# Patient Record
Sex: Male | Born: 1980 | Race: White | Hispanic: No | Marital: Married | Smoking: Former smoker
Health system: Southern US, Community
[De-identification: ages and names within clinical notes are randomized; demographics above are authoritative.]

## PROBLEM LIST (undated history)

## (undated) DIAGNOSIS — S72001A Fracture of unspecified part of neck of right femur, initial encounter for closed fracture: Secondary | ICD-10-CM

## (undated) HISTORY — PX: NO PAST SURGERIES: SHX2092

---

## 2016-06-15 ENCOUNTER — Ambulatory Visit: Admit: 2016-06-15 | Payer: Self-pay | Admitting: Orthopedic Surgery

## 2016-06-15 SURGERY — FIXATION, FEMUR, NECK, PERCUTANEOUS, USING SCREW
Anesthesia: General | Site: Hip | Laterality: Right

## 2016-06-22 ENCOUNTER — Encounter (HOSPITAL_COMMUNITY): Payer: Self-pay | Admitting: *Deleted

## 2016-06-22 ENCOUNTER — Other Ambulatory Visit (HOSPITAL_COMMUNITY): Payer: Self-pay | Admitting: *Deleted

## 2016-06-22 NOTE — Progress Notes (Signed)
Pt denies cardiac history, chest pain or sob. 

## 2016-06-23 ENCOUNTER — Other Ambulatory Visit: Payer: Self-pay | Admitting: Orthopedic Surgery

## 2016-06-23 NOTE — Anesthesia Preprocedure Evaluation (Incomplete Revision)
Anesthesia Evaluation  Patient identified by MRN, date of birth, ID band Patient awake    Reviewed: Allergy & Precautions, H&P , Patient's Chart, lab work & pertinent test results  Airway Mallampati: II  TM Distance: >3 FB Neck ROM: full    Dental no notable dental hx.    Pulmonary former smoker,    Pulmonary exam normal breath sounds clear to auscultation       Cardiovascular Exercise Tolerance: Good  Rhythm:regular Rate:Normal     Neuro/Psych    GI/Hepatic   Endo/Other    Renal/GU      Musculoskeletal   Abdominal   Peds  Hematology   Anesthesia Other Findings   Reproductive/Obstetrics                             Anesthesia Physical Anesthesia Plan  ASA: II  Anesthesia Plan: Spinal   Post-op Pain Management:    Induction:   Airway Management Planned:   Additional Equipment:   Intra-op Plan:   Post-operative Plan:   Informed Consent: I have reviewed the patients History and Physical, chart, labs and discussed the procedure including the risks, benefits and alternatives for the proposed anesthesia with the patient or authorized representative who has indicated his/her understanding and acceptance.   Dental Advisory Given  Plan Discussed with: CRNA  Anesthesia Plan Comments: (Lab work confirmed with CRNA in room. Platelets okay. Discussed spinal anesthetic, and patient consents to the procedure:  included risk of possible headache,backache, failed block, allergic reaction, and nerve injury. This patient was asked if she had any questions or concerns before the procedure started. )        Anesthesia Quick Evaluation  

## 2016-06-23 NOTE — Anesthesia Preprocedure Evaluation (Signed)
Anesthesia Evaluation  Patient identified by MRN, date of birth, ID band Patient awake    Reviewed: Allergy & Precautions, H&P , Patient's Chart, lab work & pertinent test results  Airway Mallampati: II  TM Distance: >3 FB Neck ROM: full    Dental no notable dental hx.    Pulmonary former smoker,    Pulmonary exam normal breath sounds clear to auscultation       Cardiovascular Exercise Tolerance: Good  Rhythm:regular Rate:Normal     Neuro/Psych    GI/Hepatic   Endo/Other    Renal/GU      Musculoskeletal   Abdominal   Peds  Hematology   Anesthesia Other Findings   Reproductive/Obstetrics                             Anesthesia Physical Anesthesia Plan  ASA: II  Anesthesia Plan: Spinal   Post-op Pain Management:    Induction:   Airway Management Planned:   Additional Equipment:   Intra-op Plan:   Post-operative Plan:   Informed Consent: I have reviewed the patients History and Physical, chart, labs and discussed the procedure including the risks, benefits and alternatives for the proposed anesthesia with the patient or authorized representative who has indicated his/her understanding and acceptance.   Dental Advisory Given  Plan Discussed with: CRNA  Anesthesia Plan Comments: (Lab work confirmed with CRNA in room. Platelets okay. Discussed spinal anesthetic, and patient consents to the procedure:  included risk of possible headache,backache, failed block, allergic reaction, and nerve injury. This patient was asked if she had any questions or concerns before the procedure started. )        Anesthesia Quick Evaluation  

## 2016-06-24 ENCOUNTER — Encounter (HOSPITAL_COMMUNITY)
Admission: RE | Disposition: A | Discharge status: Home / Self Care | Payer: Self-pay | Source: Ambulatory Visit | Attending: Orthopedic Surgery

## 2016-06-24 ENCOUNTER — Ambulatory Visit (HOSPITAL_COMMUNITY): Payer: Self-pay | Admitting: Anesthesiology

## 2016-06-24 ENCOUNTER — Observation Stay (HOSPITAL_COMMUNITY)
Admission: RE | Admit: 2016-06-24 | Discharge: 2016-06-24 | Disposition: A | Discharge status: Home / Self Care | Payer: Self-pay | Source: Ambulatory Visit | Attending: Orthopedic Surgery | Admitting: Orthopedic Surgery

## 2016-06-24 ENCOUNTER — Observation Stay (HOSPITAL_COMMUNITY): Payer: Self-pay

## 2016-06-24 ENCOUNTER — Ambulatory Visit (HOSPITAL_COMMUNITY): Payer: Self-pay

## 2016-06-24 ENCOUNTER — Encounter (HOSPITAL_COMMUNITY): Payer: Self-pay | Admitting: Anesthesiology

## 2016-06-24 DIAGNOSIS — M84351A Stress fracture, right femur, initial encounter for fracture: Principal | ICD-10-CM | POA: Insufficient documentation

## 2016-06-24 DIAGNOSIS — Z419 Encounter for procedure for purposes other than remedying health state, unspecified: Secondary | ICD-10-CM

## 2016-06-24 DIAGNOSIS — Z8781 Personal history of (healed) traumatic fracture: Secondary | ICD-10-CM

## 2016-06-24 DIAGNOSIS — Z87891 Personal history of nicotine dependence: Secondary | ICD-10-CM | POA: Insufficient documentation

## 2016-06-24 DIAGNOSIS — S72001A Fracture of unspecified part of neck of right femur, initial encounter for closed fracture: Secondary | ICD-10-CM | POA: Diagnosis present

## 2016-06-24 DIAGNOSIS — Z9889 Other specified postprocedural states: Secondary | ICD-10-CM

## 2016-06-24 DIAGNOSIS — X58XXXA Exposure to other specified factors, initial encounter: Secondary | ICD-10-CM | POA: Insufficient documentation

## 2016-06-24 HISTORY — DX: Fracture of unspecified part of neck of right femur, initial encounter for closed fracture: S72.001A

## 2016-06-24 HISTORY — PX: HIP PINNING,CANNULATED: SHX1758

## 2016-06-24 LAB — CBC
HEMATOCRIT: 42.1 % (ref 39.0–52.0)
HEMOGLOBIN: 14.6 g/dL (ref 13.0–17.0)
MCH: 30.5 pg (ref 26.0–34.0)
MCHC: 34.7 g/dL (ref 30.0–36.0)
MCV: 88.1 fL (ref 78.0–100.0)
Platelets: 207 10*3/uL (ref 150–400)
RBC: 4.78 MIL/uL (ref 4.22–5.81)
RDW: 13.2 % (ref 11.5–15.5)
WBC: 7.6 10*3/uL (ref 4.0–10.5)

## 2016-06-24 SURGERY — FIXATION, FEMUR, NECK, PERCUTANEOUS, USING SCREW
Anesthesia: Spinal | Site: Hip | Laterality: Right

## 2016-06-24 MED ORDER — ASPIRIN EC 325 MG PO TBEC: 325.0000 mg | DELAYED_RELEASE_TABLET | Freq: Every day | ORAL | 0 refills | Status: AC

## 2016-06-24 MED ORDER — HYDROMORPHONE HCL 1 MG/ML IJ SOLN: 0.2500 mg | INTRAMUSCULAR | Status: DC | PRN

## 2016-06-24 MED ORDER — SENNA-DOCUSATE SODIUM 8.6-50 MG PO TABS: 2.0000 | ORAL_TABLET | Freq: Every day | ORAL | 1 refills | Status: AC

## 2016-06-24 MED ORDER — LACTATED RINGERS IV SOLN
INTRAVENOUS | Status: DC | PRN
  Administered 2016-06-24: 08:00:00 via INTRAVENOUS

## 2016-06-24 MED ORDER — MIDAZOLAM HCL 5 MG/5ML IJ SOLN
INTRAMUSCULAR | Status: DC | PRN
  Administered 2016-06-24: 2 mg via INTRAVENOUS

## 2016-06-24 MED ORDER — METHOCARBAMOL 500 MG PO TABS: 500.0000 mg | ORAL_TABLET | Freq: Four times a day (QID) | ORAL | Status: DC | PRN

## 2016-06-24 MED ORDER — LACTATED RINGERS IV SOLN
INTRAVENOUS | Status: DC
  Administered 2016-06-24: 08:00:00 via INTRAVENOUS

## 2016-06-24 MED ORDER — 0.9 % SODIUM CHLORIDE (POUR BTL) OPTIME
TOPICAL | Status: DC | PRN
  Administered 2016-06-24: 1000 mL

## 2016-06-24 MED ORDER — BACLOFEN 10 MG PO TABS: 10.0000 mg | ORAL_TABLET | Freq: Three times a day (TID) | ORAL | 0 refills | Status: AC

## 2016-06-24 MED ORDER — ONDANSETRON HCL 4 MG/2ML IJ SOLN
INTRAMUSCULAR | Status: DC | PRN
  Administered 2016-06-24: 4 mg via INTRAVENOUS

## 2016-06-24 MED ORDER — ACETAMINOPHEN 160 MG/5ML PO SOLN
960.0000 mg | Freq: Once | ORAL | Status: AC
  Administered 2016-06-24: 960 mg via ORAL
  Filled 2016-06-24 (×2): qty 30

## 2016-06-24 MED ORDER — PROPOFOL 500 MG/50ML IV EMUL
INTRAVENOUS | Status: DC | PRN
  Administered 2016-06-24: 25 ug/kg/min via INTRAVENOUS

## 2016-06-24 MED ORDER — ONDANSETRON HCL 4 MG PO TABS: 4.0000 mg | ORAL_TABLET | Freq: Three times a day (TID) | ORAL | 0 refills | Status: AC | PRN

## 2016-06-24 MED ORDER — SODIUM CHLORIDE 0.9 % IV SOLN
75.0000 mL/h | INTRAVENOUS | Status: DC
  Administered 2016-06-24: 75 mL/h via INTRAVENOUS

## 2016-06-24 MED ORDER — PROPOFOL 10 MG/ML IV BOLUS
INTRAVENOUS | Status: DC | PRN
  Administered 2016-06-24 (×2): 20 mg via INTRAVENOUS

## 2016-06-24 MED ORDER — PROPOFOL 10 MG/ML IV BOLUS
INTRAVENOUS | Status: AC
  Filled 2016-06-24: qty 40

## 2016-06-24 MED ORDER — METHOCARBAMOL 1000 MG/10ML IJ SOLN: 500.0000 mg | Freq: Four times a day (QID) | INTRAVENOUS | Status: DC | PRN

## 2016-06-24 MED ORDER — HYDROCODONE-ACETAMINOPHEN 5-325 MG PO TABS
1.0000 | ORAL_TABLET | Freq: Four times a day (QID) | ORAL | Status: DC | PRN
  Administered 2016-06-24: 2 via ORAL

## 2016-06-24 MED ORDER — HYDROCODONE-ACETAMINOPHEN 5-325 MG PO TABS
ORAL_TABLET | ORAL | Status: DC
Start: 2016-06-24 — End: 2016-06-24
  Filled 2016-06-24: qty 2

## 2016-06-24 MED ORDER — BUPIVACAINE HCL (PF) 0.25 % IJ SOLN
INTRAMUSCULAR | Status: AC
  Filled 2016-06-24: qty 30

## 2016-06-24 MED ORDER — FENTANYL CITRATE (PF) 250 MCG/5ML IJ SOLN
INTRAMUSCULAR | Status: AC
  Filled 2016-06-24: qty 5

## 2016-06-24 MED ORDER — CEFAZOLIN SODIUM-DEXTROSE 2-4 GM/100ML-% IV SOLN
2.0000 g | INTRAVENOUS | Status: AC
  Administered 2016-06-24: 2 g via INTRAVENOUS
  Filled 2016-06-24: qty 100

## 2016-06-24 MED ORDER — HYDROCODONE-ACETAMINOPHEN 10-325 MG PO TABS: 1.0000 | ORAL_TABLET | Freq: Four times a day (QID) | ORAL | 0 refills | Status: AC | PRN

## 2016-06-24 MED ORDER — MIDAZOLAM HCL 2 MG/2ML IJ SOLN
INTRAMUSCULAR | Status: AC
  Filled 2016-06-24: qty 2

## 2016-06-24 MED ORDER — LIDOCAINE HCL (CARDIAC) 20 MG/ML IV SOLN
INTRAVENOUS | Status: DC | PRN
  Administered 2016-06-24: 50 mg via INTRATRACHEAL

## 2016-06-24 MED ORDER — FENTANYL CITRATE (PF) 100 MCG/2ML IJ SOLN
INTRAMUSCULAR | Status: DC | PRN
  Administered 2016-06-24 (×5): 50 ug via INTRAVENOUS

## 2016-06-24 SURGICAL SUPPLY — 44 items
BENZOIN TINCTURE PRP APPL 2/3 (GAUZE/BANDAGES/DRESSINGS) IMPLANT
BIT DRILL CANN LRG QC 5X300 (BIT) ×3 IMPLANT
BNDG COHESIVE 6X5 TAN STRL LF (GAUZE/BANDAGES/DRESSINGS) IMPLANT
BOOTCOVER CLEANROOM LRG (PROTECTIVE WEAR) ×6 IMPLANT
CLOSURE STERI-STRIP 1/2X4 (GAUZE/BANDAGES/DRESSINGS) ×1
CLSR STERI-STRIP ANTIMIC 1/2X4 (GAUZE/BANDAGES/DRESSINGS) ×2 IMPLANT
COVER PERINEAL POST (MISCELLANEOUS) ×3 IMPLANT
COVER SURGICAL LIGHT HANDLE (MISCELLANEOUS) ×3 IMPLANT
DRAPE STERI IOBAN 125X83 (DRAPES) ×3 IMPLANT
DRSG MEPILEX BORDER 4X4 (GAUZE/BANDAGES/DRESSINGS) ×3 IMPLANT
DRSG PAD ABDOMINAL 8X10 ST (GAUZE/BANDAGES/DRESSINGS) IMPLANT
DURAPREP 26ML APPLICATOR (WOUND CARE) ×3 IMPLANT
ELECT REM PT RETURN 9FT ADLT (ELECTROSURGICAL) ×3
ELECTRODE REM PT RTRN 9FT ADLT (ELECTROSURGICAL) ×1 IMPLANT
GLOVE BIO SURGEON STRL SZ7 (GLOVE) ×3 IMPLANT
GLOVE BIOGEL PI IND STRL 7.0 (GLOVE) ×1 IMPLANT
GLOVE BIOGEL PI INDICATOR 7.0 (GLOVE) ×2
GLOVE BIOGEL PI ORTHO PRO SZ8 (GLOVE) ×4
GLOVE ORTHO TXT STRL SZ7.5 (GLOVE) ×3 IMPLANT
GLOVE PI ORTHO PRO STRL SZ8 (GLOVE) ×2 IMPLANT
GLOVE SURG ORTHO 8.0 STRL STRW (GLOVE) ×6 IMPLANT
GOWN STRL REUS W/ TWL XL LVL3 (GOWN DISPOSABLE) ×1 IMPLANT
GOWN STRL REUS W/TWL 2XL LVL3 (GOWN DISPOSABLE) IMPLANT
GOWN STRL REUS W/TWL XL LVL3 (GOWN DISPOSABLE) ×2
GUIDEWIRE THREADED 2.8 (WIRE) ×9 IMPLANT
KIT BASIN OR (CUSTOM PROCEDURE TRAY) ×3 IMPLANT
KIT ROOM TURNOVER OR (KITS) ×3 IMPLANT
LINER BOOT UNIVERSAL DISP (MISCELLANEOUS) ×3 IMPLANT
MANIFOLD NEPTUNE II (INSTRUMENTS) ×3 IMPLANT
NEEDLE 22X1 1/2 (OR ONLY) (NEEDLE) ×3 IMPLANT
NS IRRIG 1000ML POUR BTL (IV SOLUTION) ×3 IMPLANT
PACK GENERAL/GYN (CUSTOM PROCEDURE TRAY) ×3 IMPLANT
PAD ARMBOARD 7.5X6 YLW CONV (MISCELLANEOUS) ×6 IMPLANT
SCREW CANN 16 THRD/85 7.3 (Screw) ×6 IMPLANT
SCREW CANN 16 THRD/90 7.3 (Screw) ×3 IMPLANT
SUT VIC AB 2-0 FS1 27 (SUTURE) ×3 IMPLANT
SUT VIC AB 2-0 SH 27 (SUTURE)
SUT VIC AB 2-0 SH 27XBRD (SUTURE) IMPLANT
SUT VIC AB 3-0 SH 27 (SUTURE)
SUT VIC AB 3-0 SH 27X BRD (SUTURE) IMPLANT
SYR CONTROL 10ML LL (SYRINGE) ×3 IMPLANT
TOWEL OR 17X24 6PK STRL BLUE (TOWEL DISPOSABLE) ×3 IMPLANT
TOWEL OR 17X26 10 PK STRL BLUE (TOWEL DISPOSABLE) ×3 IMPLANT
WATER STERILE IRR 1000ML POUR (IV SOLUTION) ×3 IMPLANT

## 2016-06-24 NOTE — Progress Notes (Signed)
Patient attempted to walk with crutches again.  Still unable to do so at this time.  He is moving all extremities and pain is controlled.

## 2016-06-24 NOTE — Transfer of Care (Signed)
Immediate Anesthesia Transfer of Care Note  Patient: Akito Capozzi  Procedure(s) Performed: Procedure(s): RIGHT HIP PERCUTANEOUS PINNING (Right)  Patient Location: PACU  Anesthesia Type:spinal  Level of Consciousness: awake and alert   Airway & Oxygen Therapy: Patient Spontanous Breathing  Post-op Assessment: Report given to RN and Post -op Vital signs reviewed and stable  Post vital signs: Reviewed and stable  Last Vitals:  Vitals:   06/24/16 0723 06/24/16 1035  BP: 139/85   Pulse: (!) 58   Resp: 18   Temp: 36.7 C (P) 36.4 C    Last Pain:  Vitals:   06/24/16 0723  TempSrc: Oral      Patients Stated Pain Goal: 3 (06/24/16 0723)  Complications: No apparent anesthesia complications

## 2016-06-24 NOTE — Progress Notes (Signed)
Patient taken to short stay for extended recovery.  Report given to [facility].

## 2016-06-24 NOTE — Progress Notes (Signed)
Patient attempted to ambulate with crutches, but unable to do so at this time, will await return of motor function.

## 2016-06-24 NOTE — Discharge Instructions (Signed)

## 2016-06-24 NOTE — Op Note (Signed)
06/24/2016  10:14 AM  PATIENT:  Bryan Rosales    PRE-OPERATIVE DIAGNOSIS:  Right femoral neck stress fracture  POST-OPERATIVE DIAGNOSIS:  Same  PROCEDURE:  RIGHT HIP PERCUTANEOUS PINNING  SURGEON:  Eulas Post, MD  PHYSICIAN ASSISTANT: Janace Litten, OPA-C, present and scrubbed throughout the case, critical for completion in a timely fashion, and for retraction, instrumentation, and closure.  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  Preoperative MRI demonstrated evidence for a femoral neck stress fracture that extended to the tension surface. He elected for surgical management in order to minimize the risk for avascular necrosis, progression of nonunion, persistent fracture, and the potential need for hip arthroplasty.  The risks benefits and alternatives were discussed with the patient preoperatively including but not limited to the risks of infection, bleeding, nerve injury, cardiopulmonary complications, blood clots, malunion, nonunion, avascular necrosis, the need for revision surgery, the potential for conversion to hemiarthroplasty, among others, and the patient was willing to proceed. We also discussed the potential for the need for removal of hardware, incomplete relief of symptoms, persistent limp, among others.  OPERATIVE IMPLANTS: 7.3 mm cannulated screws x3  OPERATIVE FINDINGS: Appropriate bone quality given his age, excellent purchase with the screws.  OPERATIVE PROCEDURE: The patient was brought to the operating room and placed in supine position. IV antibiotics were given. Spinal anesthesia administered. The patient was placed on the fracture table. The operative extremity was positioned, without any significant reduction maneuver and was prepped and draped in usual sterile fashion.  Time out was performed.  Small incision was made distal to the greater  trochanter, and 3 guidewires were introduced Into an inverted triangle configuration. The lengths were measured. The alignment was anatomic, and the fracture not visible on x-ray.  I then placed the screws into position.  Excellent fixation was achieved.  The wounds were irrigated copiously, and repaired with Vicryl with Steri-Strips and sterile gauze. There no complications and the patient tolerated the procedure well.  The patient will be touch toe weightbearing on the right lower extremity.

## 2016-06-24 NOTE — Anesthesia Postprocedure Evaluation (Signed)
Anesthesia Post Note  Patient: Technical sales engineer  Procedure(s) Performed: Procedure(s) (LRB): RIGHT HIP PERCUTANEOUS PINNING (Right)  Patient location during evaluation: PACU Anesthesia Type: Spinal Level of consciousness: awake Pain management: satisfactory to patient Vital Signs Assessment: post-procedure vital signs reviewed and stable Respiratory status: spontaneous breathing Cardiovascular status: blood pressure returned to baseline Postop Assessment: no headache and spinal receding Anesthetic complications: no    Last Vitals:  Vitals:   06/24/16 1220 06/24/16 1240  BP: (!) 145/89 123/87  Pulse: (!) 59 61  Resp: 18 17  Temp: 36.7 C     Last Pain:  Vitals:   06/24/16 1249  TempSrc:   PainSc: 4                  Lavonte Palos EDWARD

## 2016-06-24 NOTE — Progress Notes (Signed)
Per patient, Dr. Mardelle Matte told him that he could go home today  after spinal wore off.  Orders are for overnight observation.  Called Dr. Mardelle Matte to clarify and he stated that if patient met anesthesia criteria, he could be discharged home today.  Will continue to monitor.

## 2016-06-24 NOTE — Anesthesia Procedure Notes (Signed)
Procedure Name: MAC Date/Time: 06/24/2016 9:10 AM Performed by: Wray Kearns A Pre-anesthesia Checklist: Patient identified, Emergency Drugs available, Suction available, Patient being monitored and Timeout performed Patient Re-evaluated:Patient Re-evaluated prior to inductionOxygen Delivery Method: Simple face mask Intubation Type: IV induction Placement Confirmation: positive ETCO2 Dental Injury: Teeth and Oropharynx as per pre-operative assessment

## 2016-06-24 NOTE — H&P (Signed)
PREOPERATIVE H&P  Chief Complaint: Right femoral neck stress fracture  HPI: Bryan Rosales is a 35 y.o. male who presents for preoperative history and physical with a diagnosis of right femoral neck stress fracture. Symptoms are rated as moderate to severe, and have been worsening.  This is significantly impairing activities of daily living.  He has elected for surgical management. 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 Pain located over the right groin and posterior lateral aspect.  MRI demonstrates evidence for a tension sided femoral neck stress fracture.  History reviewed. No pertinent past medical history. Past Surgical History:  Procedure Laterality Date  . NO PAST SURGERIES     Social History   Social History  . Marital status: Married    Spouse name: N/A  . Number of children: N/A  . Years of education: N/A   Social History Main Topics  . Smoking status: Former Smoker    Quit date: 06/23/2003  . Smokeless tobacco: Never Used  . Alcohol use No  . Drug use: No  . Sexual activity: Not Asked   Other Topics Concern  . None   Social History Narrative  . None   Family History  Problem Relation Age of Onset  . CAD Father    Allergies  Allergen Reactions  . No Known Allergies    Prior to Admission medications   Medication Sig Start Date End Date Taking? Authorizing Provider  Multiple Vitamin (MULTIVITAMIN) tablet Take 1 tablet by mouth daily.   Yes Historical Provider, MD     Positive ROS: All other systems have been reviewed and were otherwise negative with the exception of those mentioned in the HPI and as above.  Physical Exam: General: Alert, no acute distress Cardiovascular: No pedal edema Respiratory: No cyanosis, no use of accessory musculature GI: No organomegaly, abdomen is soft and non-tender Skin: No lesions in the area of chief complaint Neurologic:  Sensation intact distally Psychiatric: Patient is competent for consent with normal mood and affect Lymphatic: No axillary or cervical lymphadenopathy  MUSCULOSKELETAL: Right hip has positive pain with any type of range of motion, difficulty with bearing weight on it, pain located around the right groin, EHL and FHL are intact.  Assessment: Right femoral neck stress fracture   Plan: Plan for Procedure(s): CANNULATED HIP PINNING  The risks benefits and alternatives were discussed with the patient including but not limited to the risks of nonoperative treatment, versus surgical intervention including infection, bleeding, nerve injury,  blood clots, cardiopulmonary complications, morbidity, mortality, among others, and they were willing to proceed. We have also discussed the potential need for hardware removal, the potential for avascular necrosis and progression to hip replacement, nonunion, among others.  Eulas Post, MD Cell [phone]   06/24/2016 8:57 AM

## 2016-06-24 NOTE — Anesthesia Procedure Notes (Signed)
Spinal  Patient location during procedure: OR Preanesthetic Checklist Completed: patient identified, site marked, surgical consent, pre-op evaluation, timeout performed, IV checked, risks and benefits discussed and monitors and equipment checked Spinal Block Patient position: sitting Prep: DuraPrep Patient monitoring: heart rate, cardiac monitor, continuous pulse ox and blood pressure Approach: midline Location: L3-4 Injection technique: single-shot Needle Needle type: Sprotte  Needle gauge: 24 G Needle length: 9 cm Assessment Sensory level: T4 Additional Notes Spinal Dosage in OR  Bupivicaine ml       1.9 RLD x 2 min

## 2016-06-24 NOTE — Progress Notes (Signed)
Patient able to move extremities, sensation returning, and has voided.  Per, Dr. Jean Rosenthal, as long as he can ambulate with crutches with nursing staff, he may be discharged home.  Wife to bring crutches to discharge area.

## 2016-06-25 ENCOUNTER — Encounter (HOSPITAL_COMMUNITY): Payer: Self-pay | Admitting: Orthopedic Surgery

## 2016-09-04 NOTE — Discharge Summary (Signed)
Physician Discharge Summary  Patient ID: Bryan FiedlerSean Haertel MRN: [id number] DOB/AGE: 07/19/1981 35 y.o.  Admit date: 06/24/2016 Discharge date: 06/24/2016  Admission Diagnoses:  Fracture of femoral neck, right (HCC), stress fracture  Discharge Diagnoses:  Principal Problem:   Fracture of femoral neck, right (HCC), stress fracture Active Problems:   Displaced fracture of right femoral neck (HCC)   Past Medical History:  Diagnosis Date  . Fracture of femoral neck, right (HCC) 06/24/2016    Surgeries: Procedure(s): RIGHT HIP PERCUTANEOUS PINNING on 06/24/2016   Consultants (if any):   Discharged Condition: Improved  Hospital Course: Bryan FiedlerSean Laughner is an 35 y.o. male who was admitted 06/24/2016 with a diagnosis of Fracture of femoral neck, right (HCC) and went to the operating room on 06/24/2016 and underwent the above named procedures.    He was given perioperative antibiotics:  Anti-infectives    Start     Dose/Rate Route Frequency Ordered Stop   06/24/16 0815  ceFAZolin (ANCEF) IVPB 2g/100 mL premix     2 g 200 mL/hr over 30 Minutes Intravenous To [facility]Stay [facility] 06/24/16 0655 06/24/16 0950    .  He was given sequential compression devices, early ambulation, and aspirin for DVT prophylaxis.  He benefited maximally from the hospital stay and there were no complications.  He was discharged from the PACU.  Recent vital signs:  Vitals:   06/24/16 1445 06/24/16 1500  BP: 134/85 134/81  Pulse: 73 73  Resp: 20 16  Temp: 97.6 F (36.4 C)     Recent laboratory studies:  Lab Results  Component Value Date   HGB 14.6 06/24/2016   Lab Results  Component Value Date   WBC 7.6 06/24/2016   PLT 207 06/24/2016   No results found for: INR No results found for: NA, K, CL, CO2, BUN, CREATININE, GLUCOSE  Discharge Medications:     Medication List    TAKE these medications   aspirin EC 325 MG tablet Take 1 tablet (325 mg total) by mouth daily.   baclofen 10 MG tablet Commonly  known as:  LIORESAL Take 1 tablet (10 mg total) by mouth 3 (three) times daily. As needed for muscle spasm   HYDROcodone-acetaminophen 10-325 MG tablet Commonly known as:  NORCO Take 1-2 tablets by mouth every 6 (six) hours as needed.   multivitamin tablet Take 1 tablet by mouth daily.   ondansetron 4 MG tablet Commonly known as:  ZOFRAN Take 1 tablet (4 mg total) by mouth every 8 (eight) hours as needed for nausea or vomiting.   sennosides-docusate sodium 8.6-50 MG tablet Commonly known as:  SENOKOT-S Take 2 tablets by mouth daily.       Diagnostic Studies: No results found.  Disposition: 01-Home or Self Care    Follow-up Information    Josilyn Shippee P, MD. Schedule an appointment as soon as possible for a visit in 2 weeks.   Specialty:  Orthopedic Surgery Contact information: [address] [place] [address] [phone]            Signed: Eulas PostLANDAU,Kambri Dismore P 09/04/2016, 11:08 PM

## 2018-02-20 IMAGING — RF DG HIP (WITH PELVIS) OPERATIVE*R*
1 series · 3 of 3 positions shown · non-contrast
Comparison: MRI 06/14/2016

CLINICAL DATA: Right cannulated hip pending.

EXAM:
OPERATIVE right HIP (WITH PELVIS IF PERFORMED) 3 VIEWS
TECHNIQUE: Fluoroscopic spot image(s) were submitted for interpretation
post-operatively.

[Series 1: run · 3 of 3 slices shown]
[im 1/3]
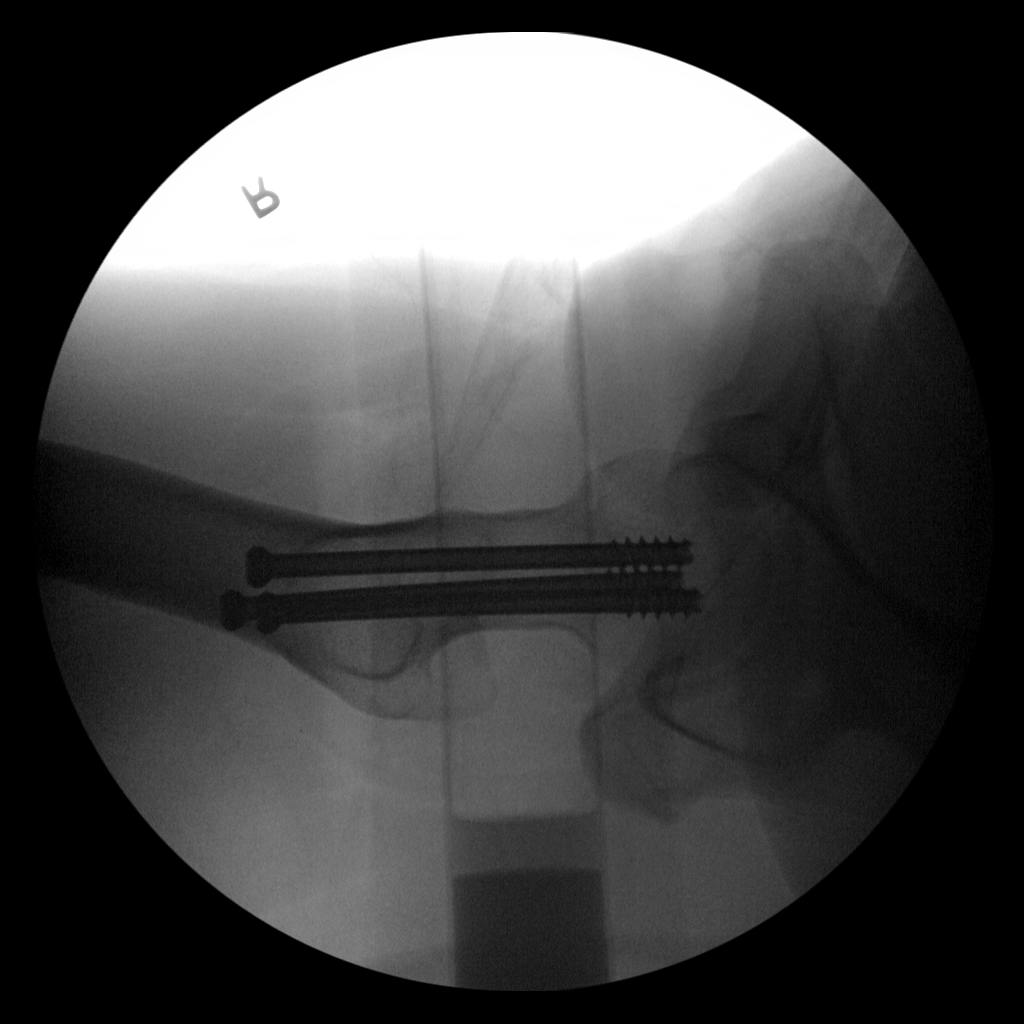
[im 2/3]
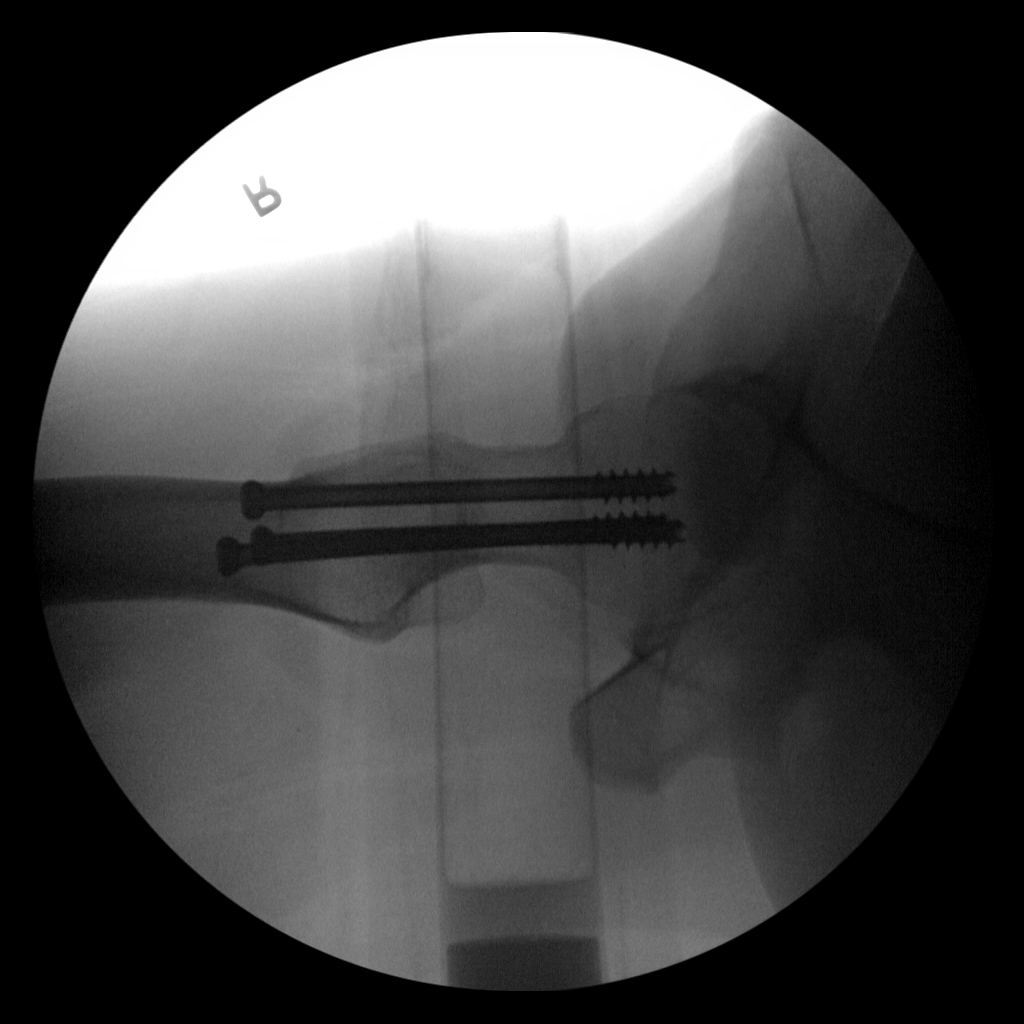
[im 3/3]
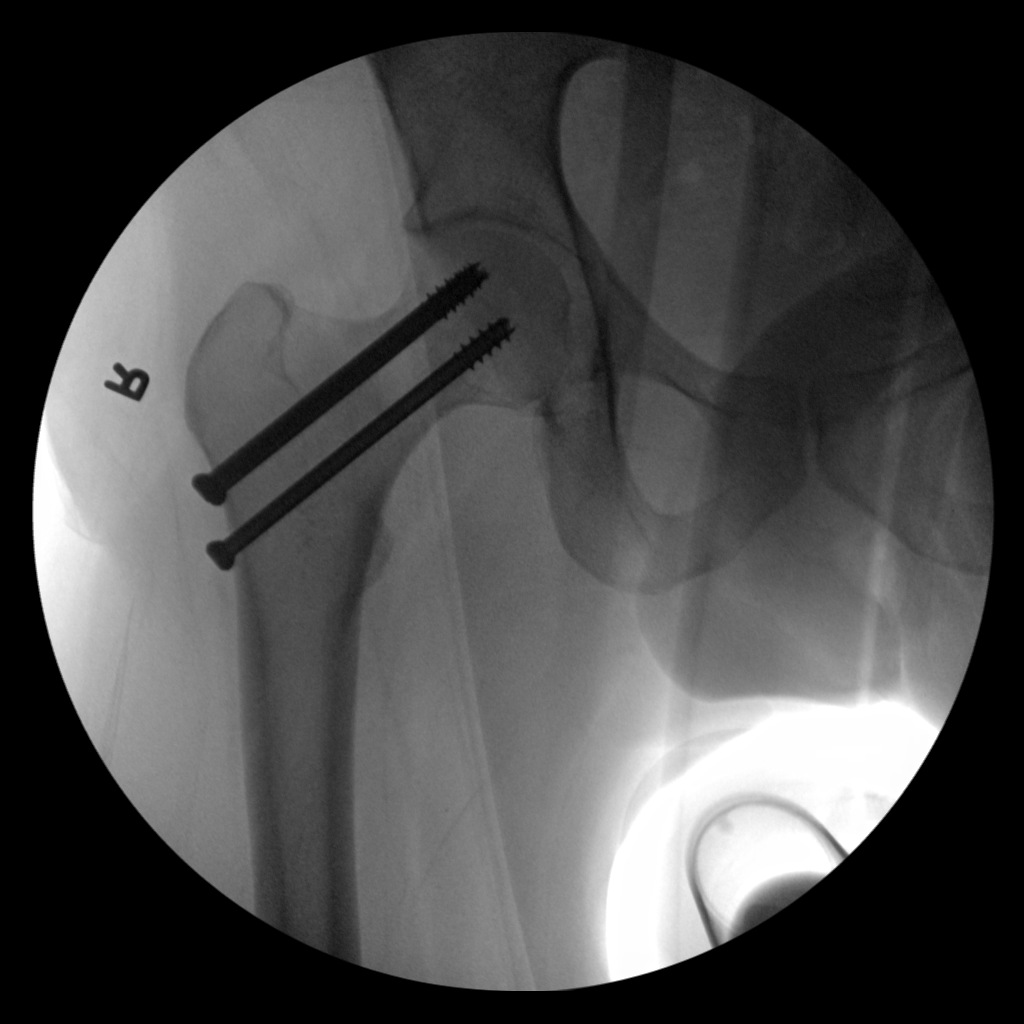

[3 of 3 positions shown; findings below may reference images not displayed]

FINDINGS: Examination demonstrates placement of 3 orthopedic screws from
inferior to posterior bridging the femoral neck into the femoral
head fixating patient's basicervical nondisplaced femoral neck
fracture. Hardware is intact. Normal alignment over the fracture
site.
IMPRESSION: Fixation of femoral neck fracture with hardware intact and normal
anatomic alignment over the fracture site.
